# Patient Record
Sex: Male | Born: 1955
Health system: Southern US, Community
[De-identification: ages and names within clinical notes are randomized; demographics above are authoritative.]

---

## 2007-06-21 ENCOUNTER — Emergency Department (HOSPITAL_COMMUNITY): Admission: EM | Admit: 2007-06-21 | Discharge: 2007-06-21 | Payer: Self-pay | Admitting: Emergency Medicine

## 2008-12-09 ENCOUNTER — Encounter: Admission: RE | Admit: 2008-12-09 | Discharge: 2008-12-09 | Payer: Self-pay | Admitting: Gastroenterology

## 2010-01-14 IMAGING — RF DG ESOPHAGUS
15 of 17 series · 19 of 24 positions shown · non-contrast
Comparison: None.

CLINICAL DATA: Dysphagia

ESOPHOGRAM/BARIUM SWALLOW
TECHNIQUE: Combined double contrast and single contrast
examination performed using effervescent crystals, thick barium
liquid, and thin barium liquid.
Fluoroscopy time:  2.5 minutes.

[Series 1: run · 2 of 10 slices shown (1 of 15)]
[im 1/10]
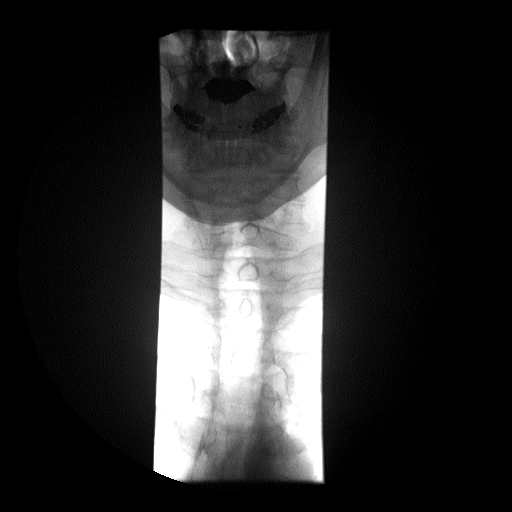
[im 10/10]
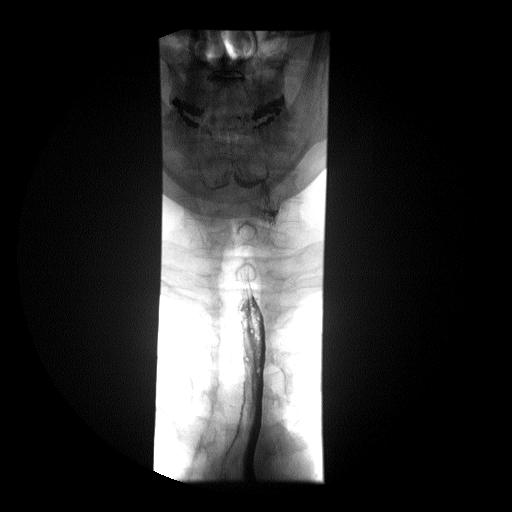

[Series 2: run · 2 of 12 slices shown (2 of 15)]
[im 6/12]
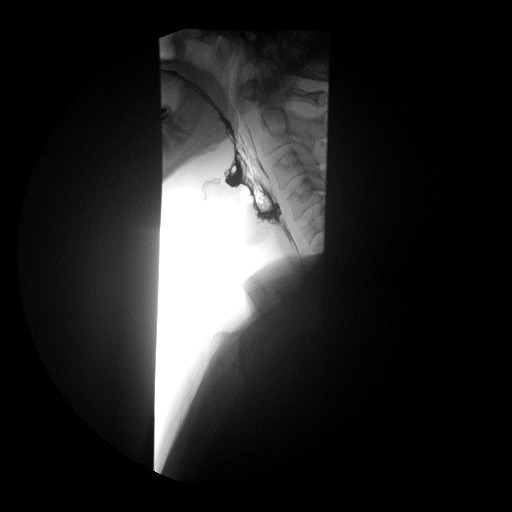
[im 12/12]
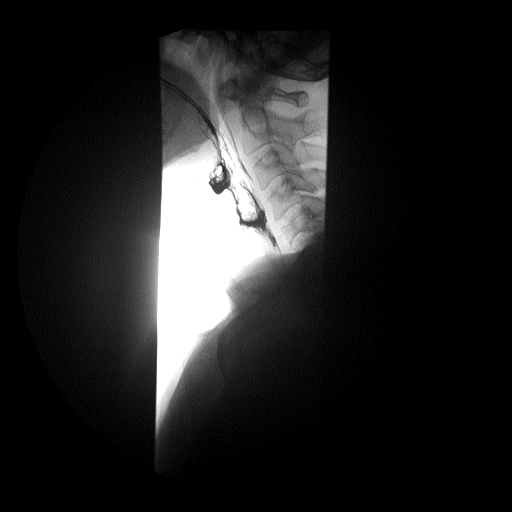

[Series 3: run · 2 of 10 slices shown (3 of 15)]
[im 1/10]
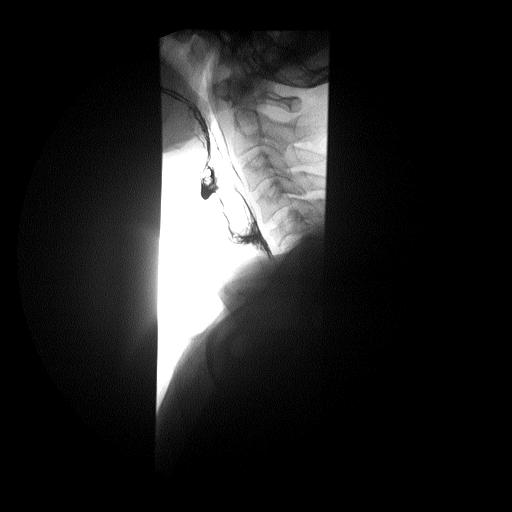
[im 5/10]
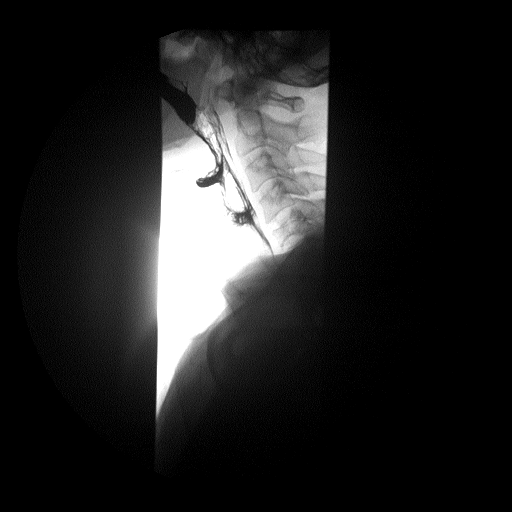

[Series 4: run · 1 of 1 slices shown (4 of 15)]
[im 1/1]
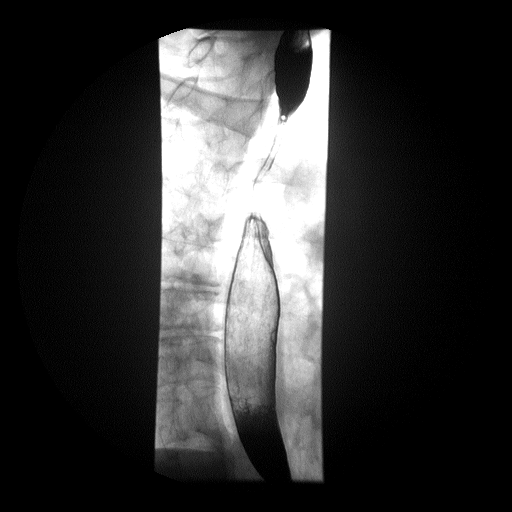

[Series 5: run · 1 of 2 slices shown (5 of 15)]
[im 1/2]
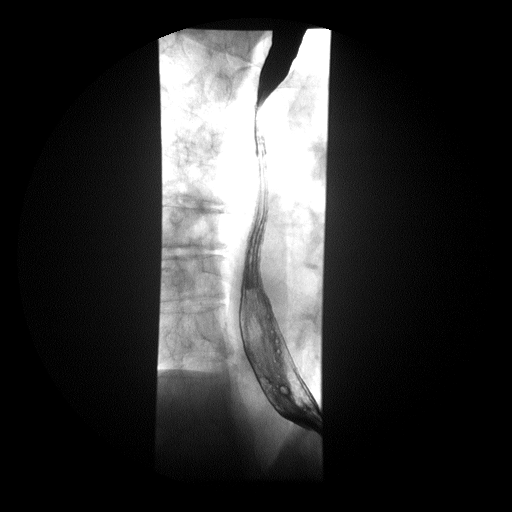

[Series 6: run · 1 of 1 slices shown (6 of 15)]
[im 1/1]
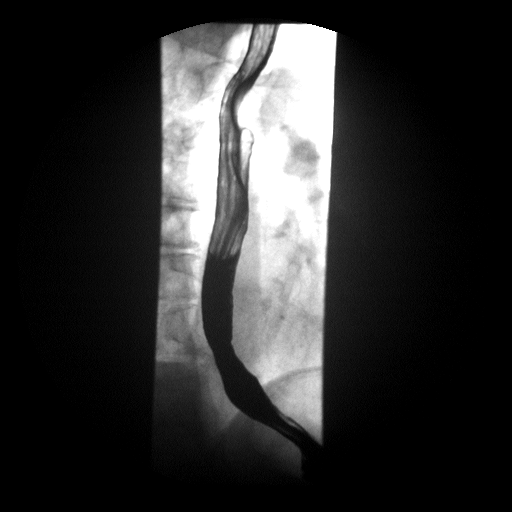

[Series 8: run · 1 of 1 slices shown (7 of 15)]
[im 1/1]
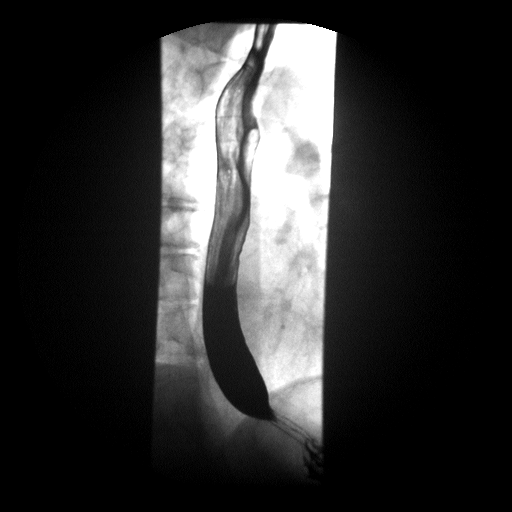

[Series 9: run · 1 of 1 slices shown (8 of 15)]
[im 1/1]
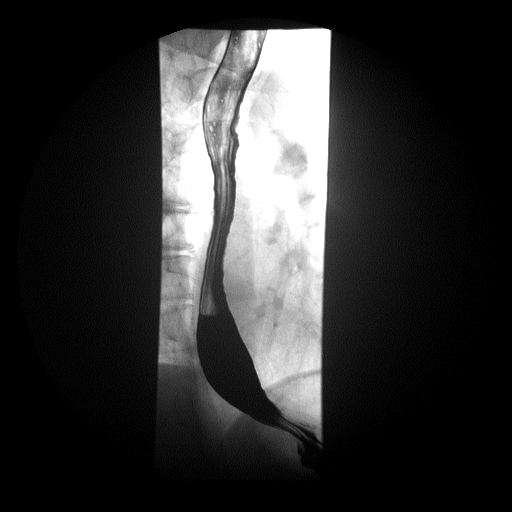

[Series 10: run · 1 of 1 slices shown (9 of 15)]
[im 1/1]
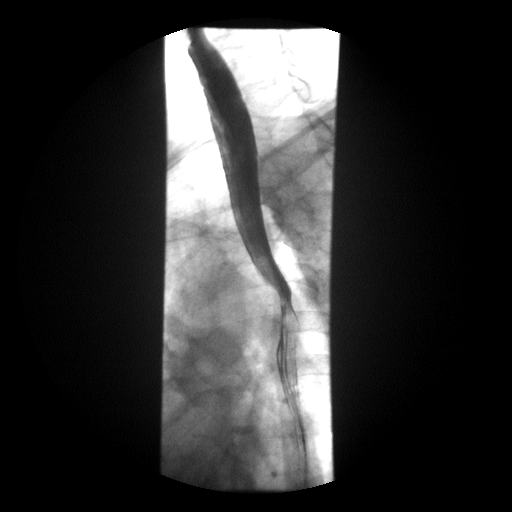

[Series 11: run · 1 of 1 slices shown (10 of 15)]
[im 1/1]
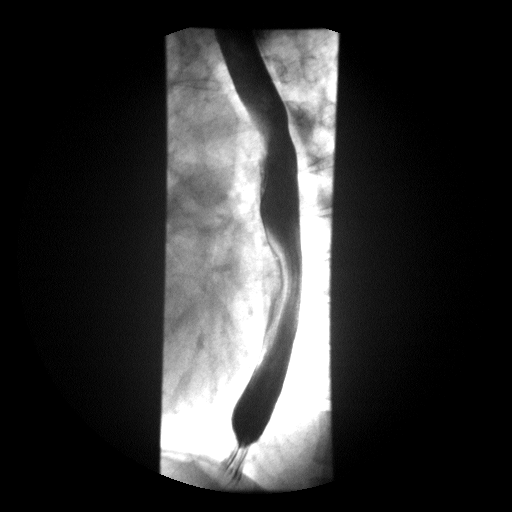

[Series 13: run · 1 of 1 slices shown (11 of 15)]
[im 1/1]
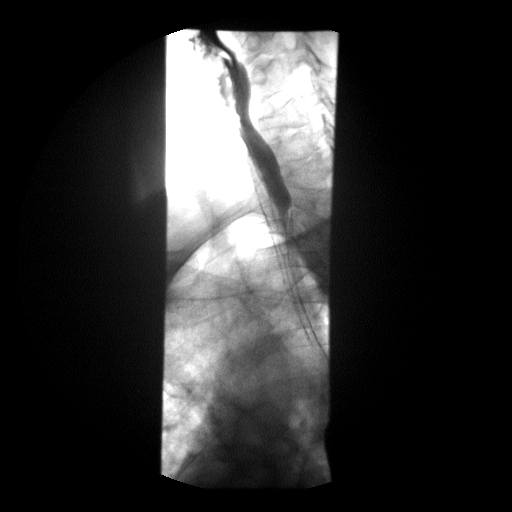

[Series 14: run · 1 of 1 slices shown (12 of 15)]
[im 1/1]
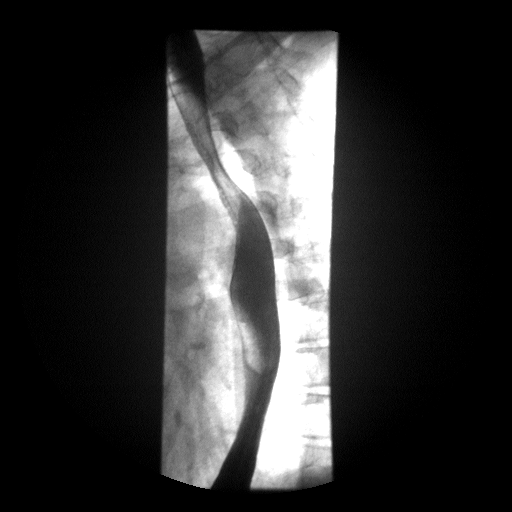

[Series 15: run · 1 of 1 slices shown (13 of 15)]
[im 1/1]
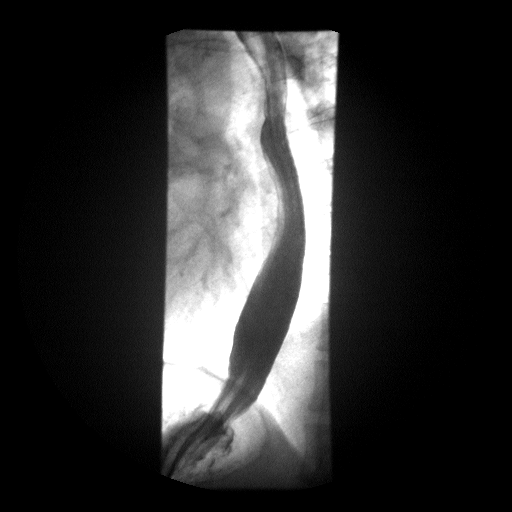

[Series 16: run · 1 of 1 slices shown (14 of 15)]
[im 1/1]
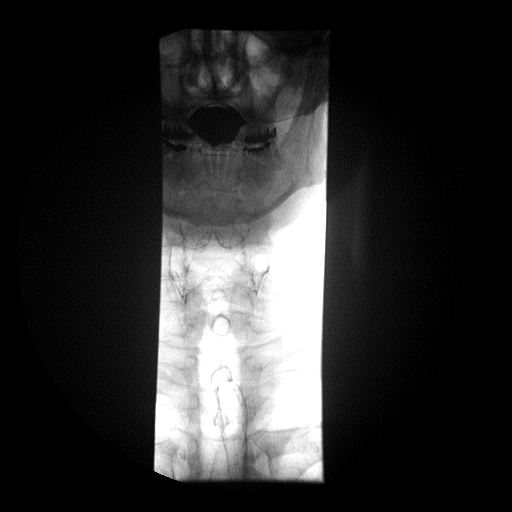

[Series 17: run · 2 of 8 slices shown (15 of 15)]
[im 4/8]
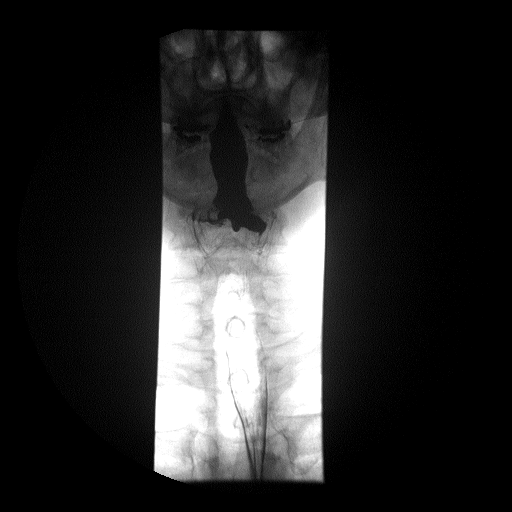
[im 8/8]
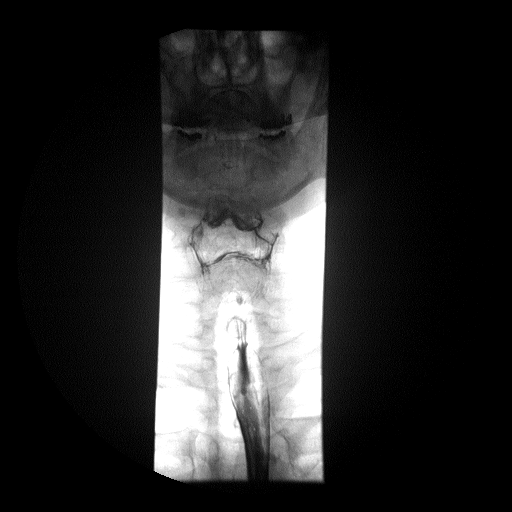

[19 of 24 positions shown; findings below may reference images not displayed]

FINDINGS: Frontal imaging of the hypopharynx with swallowing shows
some asymmetry of contrast migration from the hypopharynx into the
proximal esophagus.  This results in appearance of a right-sided
mass effect in the hypopharynx.  However after swallowing, the
contrast coated mucosa of the hypopharynx is quite symmetric and
normal in appearance.  Lateral views of the hypopharynx while
swallowing are normal.

Double contrast imaging of the esophagus is normal.  There is no
esophageal mass, stricture, or diverticulum.

Esophageal motility is normal.

A 13 mm barium tablet passes readily into the stomach when taken
with water.
IMPRESSION: Some asymmetry in the hypopharyngeal region with swallowing raises
the question of mass effect on the right.  This is not evident when
the hypopharynx is distended after swallowing and coated with
barium.  Although considered unlikely, a right-sided hypopharyngeal
mass is not entirely excluded. Consider direct visualization to
exclude underlying mass lesion.

Otherwise normal double contrast barium esophagram.

## 2013-09-09 ENCOUNTER — Other Ambulatory Visit: Payer: Self-pay | Admitting: Family Medicine

## 2013-09-09 DIAGNOSIS — IMO0002 Reserved for concepts with insufficient information to code with codable children: Secondary | ICD-10-CM

## 2013-09-09 DIAGNOSIS — R229 Localized swelling, mass and lump, unspecified: Principal | ICD-10-CM

## 2013-09-15 ENCOUNTER — Ambulatory Visit
Admission: RE | Admit: 2013-09-15 | Discharge: 2013-09-15 | Disposition: A | Discharge status: Home / Self Care | Payer: BC Managed Care – PPO | Source: Ambulatory Visit | Attending: Family Medicine | Admitting: Family Medicine

## 2013-09-15 DIAGNOSIS — IMO0002 Reserved for concepts with insufficient information to code with codable children: Secondary | ICD-10-CM

## 2013-09-15 DIAGNOSIS — R229 Localized swelling, mass and lump, unspecified: Principal | ICD-10-CM

## 2013-09-15 MED ORDER — GADOBENATE DIMEGLUMINE 529 MG/ML IV SOLN
15.0000 mL | Freq: Once | INTRAVENOUS | Status: AC | PRN
  Administered 2013-09-15: 15 mL via INTRAVENOUS

## 2013-09-16 ENCOUNTER — Other Ambulatory Visit: Payer: Self-pay | Admitting: Family Medicine

## 2013-09-16 ENCOUNTER — Inpatient Hospital Stay
Admission: RE | Admit: 2013-09-16 | Discharge: 2013-09-16 | Disposition: A | Discharge status: Home / Self Care | Payer: Self-pay | Source: Ambulatory Visit | Attending: Family Medicine | Admitting: Family Medicine

## 2013-09-16 DIAGNOSIS — R52 Pain, unspecified: Secondary | ICD-10-CM

## 2014-10-01 ENCOUNTER — Other Ambulatory Visit: Payer: Self-pay | Admitting: Surgery

## 2014-10-01 NOTE — H&P (Signed)
Lawrence Burton 10/01/2014 10:08 AM Location: [facility] Patient #: [id number] DOB: 1955-08-19 Married / Language: Undefined / Race: Undefined Male History of Present Illness Lawrence Sportsman MD; 10/01/2014 10:49 AM) Patient words: hernia.  The patient is a 59 year old male who presents with an inguinal hernia. Patient sent for consult by PCP Dr Lawrence Burton with Lawrence Burton. Concern for inguinal hernia Pleasant active male. Originally from [place]. Here today with his wife. Rides bicycle a lot. 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 Noticed RLQ/groin pain. Concerned. Saw PCP. Right inguinal hernia diagnosed. Surgical consultation requested. Patient has bowel movement every day. No bad constipation or diarrhea. No prior abdominal surgeries. No history of prior hernias. He is wearing a truss which seems to help him feel more comfortable while he is at work. Moderate twisting activity level. No nausea or vomiting. No problems with urination. No history of infections. He does not smoke.  Other Problems Lawrence Burton, CMA; 10/01/2014 10:08 AM) Back Pain Hypercholesterolemia  Past Surgical History Lawrence Burton, CMA; 10/01/2014 10:08 AM) No pertinent past surgical history  Diagnostic Studies History Lawrence Burton, CMA; 10/01/2014 10:08 AM) Colonoscopy 5-10 years ago  Allergies Lawrence Burton, CMA; 10/01/2014 10:09 AM) No Known Drug Allergies 10/01/2014  Medication History (Lawrence Burton, CMA; 10/01/2014 10:09 AM) No Current Medications Medications Reconciled  Social History Lawrence Burton, CMA; 10/01/2014 10:08 AM) Alcohol use Occasional alcohol use. Caffeine use Coffee. No drug use Tobacco use Former smoker.  Family History Lawrence Burton, CMA; 10/01/2014 10:08 AM) Cancer Father.     Review of Systems (Lawrence Burton CMA; 10/01/2014 10:08 AM) General Not Present- Appetite Loss, Chills, Fatigue, Fever, Night Sweats, Weight Gain and Weight  Loss. Skin Not Present- Change in Wart/Mole, Dryness, Hives, Jaundice, New Lesions, Non-Healing Wounds, Rash and Ulcer. HEENT Not Present- Earache, Hearing Loss, Hoarseness, Nose Bleed, Oral Ulcers, Ringing in the Ears, Seasonal Allergies, Sinus Pain, Sore Throat, Visual Disturbances, Wears glasses/contact lenses and Yellow Eyes. Respiratory Not Present- Bloody sputum, Chronic Cough, Difficulty Breathing, Snoring and Wheezing. Breast Not Present- Breast Mass, Breast Pain, Nipple Discharge and Skin Changes. Cardiovascular Not Present- Chest Pain, Difficulty Breathing Lying Down, Leg Cramps, Palpitations, Rapid Heart Rate, Shortness of Breath and Swelling of Extremities. Gastrointestinal Not Present- Abdominal Pain, Bloating, Bloody Stool, Change in Bowel Habits, Chronic diarrhea, Constipation, Difficulty Swallowing, Excessive gas, Gets full quickly at meals, Hemorrhoids, Indigestion, Nausea, Rectal Pain and Vomiting. Male Genitourinary Not Present- Blood in Urine, Change in Urinary Stream, Frequency, Impotence, Nocturia, Painful Urination, Urgency and Urine Leakage. Musculoskeletal Not Present- Back Pain, Joint Pain, Joint Stiffness, Muscle Pain, Muscle Weakness and Swelling of Extremities. Neurological Not Present- Decreased Memory, Fainting, Headaches, Numbness, Seizures, Tingling, Tremor, Trouble walking and Weakness. Psychiatric Not Present- Anxiety, Bipolar, Change in Sleep Pattern, Depression, Fearful and Frequent crying. Endocrine Not Present- Cold Intolerance, Excessive Hunger, Hair Changes, Heat Intolerance, Hot flashes and New Diabetes. Hematology Not Present- Easy Bruising, Excessive bleeding, Gland problems, HIV and Persistent Infections.  Vitals (Lawrence Burton CMA; 10/01/2014 10:09 AM) 10/01/2014 10:09 AM Weight: 174.8 lb Height: 69in Body Surface Area: 1.96 m Body Mass Index: 25.81 kg/m Temp.: 64F(Temporal)  Pulse: 79 (Regular)  BP: 124/80 (Sitting, Left Arm,  Standard)     Physical Exam Lawrence Sportsman MD; 10/01/2014 10:46 AM)  General Mental Status-Alert. General Appearance-Not in acute distress, Not Sickly. Orientation-Oriented X3. Hydration-Well hydrated. Voice-Normal.  Integumentary Global Assessment Upon inspection and palpation of skin surfaces of the - Axillae: non-tender, no inflammation or  ulceration, no drainage. and Distribution of scalp and body hair is normal. General Characteristics Temperature - normal warmth is noted.  Head and Neck Head-normocephalic, atraumatic with no lesions or palpable masses. Face Global Assessment - atraumatic, no absence of expression. Neck Global Assessment - no abnormal movements, no bruit auscultated on the right, no bruit auscultated on the left, no decreased range of motion, non-tender. Trachea-midline. Thyroid Gland Characteristics - non-tender.  Eye Eyeball - Left-Extraocular movements intact, No Nystagmus. Eyeball - Right-Extraocular movements intact, No Nystagmus. Cornea - Left-No Hazy. Cornea - Right-No Hazy. Sclera/Conjunctiva - Left-No scleral icterus, No Discharge. Sclera/Conjunctiva - Right-No scleral icterus, No Discharge. Pupil - Left-Direct reaction to light normal. Pupil - Right-Direct reaction to light normal.  ENMT Ears Pinna - Left - no drainage observed, no generalized tenderness observed. Right - no drainage observed, no generalized tenderness observed. Nose and Sinuses External Inspection of the Nose - no destructive lesion observed. Inspection of the nares - Left - quiet respiration. Right - quiet respiration. Mouth and Throat Lips - Upper Lip - no fissures observed, no pallor noted. Lower Lip - no fissures observed, no pallor noted. Nasopharynx - no discharge present. Oral Cavity/Oropharynx - Tongue - no dryness observed. Oral Mucosa - no cyanosis observed. Hypopharynx - no evidence of airway distress observed.  Chest and Lung  Exam Inspection Movements - Normal and Symmetrical. Accessory muscles - No use of accessory muscles in breathing. Palpation Palpation of the chest reveals - Non-tender. Auscultation Breath sounds - Normal and Clear.  Cardiovascular Auscultation Rhythm - Regular. Murmurs & Other Heart Sounds - Auscultation of the heart reveals - No Murmurs and No Systolic Clicks.  Abdomen Inspection Inspection of the abdomen reveals - No Visible peristalsis and No Abnormal pulsations. Umbilicus - No Bleeding, No Urine drainage. Palpation/Percussion Palpation and Percussion of the abdomen reveal - Soft, Non Tender, No Rebound tenderness, No Rigidity (guarding) and No Cutaneous hyperesthesia.  Male Genitourinary Sexual Maturity Tanner 5 - Adult hair pattern and Adult penile size and shape. Note: Uncircumcised male. Epididymides testes and spermatic cords normal. Obvious RIGHT groin bulge reducible consistent with inguinal hernia. Subtle impulse on the LEFT side with Valsalva only   Peripheral Vascular Upper Extremity Inspection - Left - No Cyanotic nailbeds, Not Ischemic. Right - No Cyanotic nailbeds, Not Ischemic.  Neurologic Neurologic evaluation reveals -normal attention span and ability to concentrate, able to name objects and repeat phrases. Appropriate fund of knowledge , normal sensation and normal coordination. Mental Status Affect - not angry, not paranoid. Cranial Nerves-Normal Bilaterally. Gait-Normal.  Neuropsychiatric Mental status exam performed with findings of-able to articulate well with normal speech/language, rate, volume and coherence, thought content normal with ability to perform basic computations and apply abstract reasoning and no evidence of hallucinations, delusions, obsessions or homicidal/suicidal ideation.  Musculoskeletal Global Assessment Spine, Ribs and Pelvis - no instability, subluxation or laxity. Right Upper Extremity - no instability, subluxation or  laxity.  Lymphatic Head & Neck  General Head & Neck Lymphatics: Bilateral - Description - No Localized lymphadenopathy. Axillary  General Axillary Region: Bilateral - Description - No Localized lymphadenopathy. Femoral & Inguinal  Generalized Femoral & Inguinal Lymphatics: Left - Description - No Localized lymphadenopathy. Right - Description - No Localized lymphadenopathy.    Assessment & Plan Lawrence Sportsman MD; 10/01/2014 10:48 AM)  RIGHT INGUINAL HERNIA (550.90  K40.90) Impression: Definite RIGHT inguinal hernia. Possible early small LEFT inguinal hernia in a very active male with cycling in moderate activity.  I think he  would benefit from surgery to repair the RIGHT and make sure the LEFT is not an issue. The patient & his wife wish to be aggressive and avoid further problems.  He is hoping to do this and be able to recover before Labor Day to get back to the busier time of work. I noted it takes him traveling but will coordinate around surgery.  He feels like wearing the truss helps him. Reasonable to continue that as tolerated.  Current Plans Schedule for Surgery Pt Education - Pamphlet Given - Laparoscopic Hernia Repair: discussed with patient and provided information. The anatomy & physiology of the abdominal wall and pelvic floor was discussed. The pathophysiology of hernias in the inguinal and pelvic region was discussed. Natural history risks such as progressive enlargement, pain, incarceration, and strangulation was discussed. Contributors to complications such as smoking, obesity, diabetes, prior surgery, etc were discussed.  I feel the risks of no intervention will lead to serious problems that outweigh the operative risks; therefore, I recommended surgery to reduce and repair the hernia. I explained laparoscopic techniques with possible need for an open approach. I noted usual use of mesh to patch and/or buttress hernia repair  Risks such as bleeding, infection,  abscess, need for further treatment, heart attack, death, and other risks were discussed. I noted a good likelihood this will help address the problem. Goals of post-operative recovery were discussed as well. Possibility that this will not correct all symptoms was explained. I stressed the importance of low-impact activity, aggressive pain control, avoiding constipation, & not pushing through pain to minimize risk of post-operative chronic pain or injury. Possibility of reherniation was discussed. We will work to minimize complications.  An educational handout further explaining the pathology & treatment options was given as well. Questions were answered. The patient expresses understanding & wishes to proceed with surgery. Pt Education - [facility] Hernia Post-Op HCI (Tavaria Mackins): discussed with patient and provided information. Pt Education - [facility] Pain Control (Richey Doolittle) Pt Education - [facility] Good Bowel Health (Gracious Renken)  Lawrence Burton, M.D., F.A.C.S. Gastrointestinal and Minimally Invasive Surgery [facility] [address], [address] [place], [place] [address] [phone] Main / Paging

## 2017-05-01 DIAGNOSIS — H40023 Open angle with borderline findings, high risk, bilateral: Secondary | ICD-10-CM | POA: Diagnosis not present

## 2017-06-29 DIAGNOSIS — Z23 Encounter for immunization: Secondary | ICD-10-CM | POA: Diagnosis not present

## 2017-07-31 DIAGNOSIS — R208 Other disturbances of skin sensation: Secondary | ICD-10-CM | POA: Diagnosis not present

## 2018-02-19 DIAGNOSIS — K469 Unspecified abdominal hernia without obstruction or gangrene: Secondary | ICD-10-CM | POA: Diagnosis not present

## 2018-02-23 ENCOUNTER — Other Ambulatory Visit: Payer: Self-pay | Admitting: Physician Assistant

## 2018-02-26 ENCOUNTER — Other Ambulatory Visit: Payer: Self-pay | Admitting: Physician Assistant

## 2018-02-26 DIAGNOSIS — R1900 Intra-abdominal and pelvic swelling, mass and lump, unspecified site: Secondary | ICD-10-CM

## 2018-02-28 DIAGNOSIS — Z23 Encounter for immunization: Secondary | ICD-10-CM | POA: Diagnosis not present

## 2018-03-01 ENCOUNTER — Ambulatory Visit
Admission: RE | Admit: 2018-03-01 | Discharge: 2018-03-01 | Disposition: A | Discharge status: Home / Self Care | Payer: BLUE CROSS/BLUE SHIELD | Source: Ambulatory Visit | Attending: Physician Assistant | Admitting: Physician Assistant

## 2018-03-01 DIAGNOSIS — R1900 Intra-abdominal and pelvic swelling, mass and lump, unspecified site: Secondary | ICD-10-CM

## 2018-03-14 DIAGNOSIS — Z0000 Encounter for general adult medical examination without abnormal findings: Secondary | ICD-10-CM | POA: Diagnosis not present

## 2018-03-14 DIAGNOSIS — Z125 Encounter for screening for malignant neoplasm of prostate: Secondary | ICD-10-CM | POA: Diagnosis not present

## 2018-03-14 DIAGNOSIS — E7800 Pure hypercholesterolemia, unspecified: Secondary | ICD-10-CM | POA: Diagnosis not present

## 2018-05-08 DIAGNOSIS — H40023 Open angle with borderline findings, high risk, bilateral: Secondary | ICD-10-CM | POA: Diagnosis not present

## 2018-05-30 ENCOUNTER — Ambulatory Visit (INDEPENDENT_AMBULATORY_CARE_PROVIDER_SITE_OTHER): Payer: BLUE CROSS/BLUE SHIELD

## 2018-05-30 ENCOUNTER — Ambulatory Visit (INDEPENDENT_AMBULATORY_CARE_PROVIDER_SITE_OTHER): Payer: BLUE CROSS/BLUE SHIELD | Admitting: Podiatry

## 2018-05-30 ENCOUNTER — Other Ambulatory Visit: Payer: Self-pay | Admitting: Podiatry

## 2018-05-30 ENCOUNTER — Encounter: Payer: Self-pay | Admitting: Podiatry

## 2018-05-30 VITALS — BP 105/67

## 2018-05-30 DIAGNOSIS — M79672 Pain in left foot: Principal | ICD-10-CM

## 2018-05-30 DIAGNOSIS — M779 Enthesopathy, unspecified: Secondary | ICD-10-CM | POA: Diagnosis not present

## 2018-05-30 DIAGNOSIS — G629 Polyneuropathy, unspecified: Secondary | ICD-10-CM

## 2018-05-30 DIAGNOSIS — M79671 Pain in right foot: Secondary | ICD-10-CM

## 2018-05-30 MED ORDER — GABAPENTIN 300 MG PO CAPS: 300.0000 mg | ORAL_CAPSULE | Freq: Three times a day (TID) | ORAL | 3 refills | Status: AC

## 2018-05-30 NOTE — Progress Notes (Signed)
Subjective:   Patient ID: Lawrence Burton, male   DOB: 63 y.o.   MRN: [id number]   HPI Patient presents stating that he has had burning in his feet of approximate 8 months duration and does not remember any factor that may have created this.  States it is gradually gotten worse and has not had any balance issues or falling issues currently   Review of Systems  All other systems reviewed and are negative.       Objective:  Physical Exam Vitals signs and nursing note reviewed.  Constitutional:      Appearance: He is well-developed.  Pulmonary:     Effort: Pulmonary effort is normal.  Musculoskeletal: Normal range of motion.  Skin:    General: Skin is warm.  Neurological:     Mental Status: He is alert.     Neurovascular status found to be intact muscle strength was adequate range of motion within normal limits with patient found to have only mild discomfort upon palpation to the metatarsal phalangeal joints and forefoot bilateral with no breakdown of tissue noted or other pathology.  Patient was found to have good digital perfusion and is well oriented x3 and had normal sharp dull vibratory currently     Assessment:  Possibility that we may be dealing with mild neuropathic symptomatology at this time that is most likely idiopathic in origin but may involve other organ systems     Plan:  H&P x-rays reviewed conditions discussed and at this point I have recommended gabapentin starting 1 at night followed by one morning and 1 midday and we will see whether not this will control symptoms.  Patient will be seen back for [place] to reevaluate in the next 2 months or earlier if necessary  X-rays were negative for signs of bony pathology or other situation that could explain the scenario and the symptoms he is experiencing

## 2018-06-18 DIAGNOSIS — Z1211 Encounter for screening for malignant neoplasm of colon: Secondary | ICD-10-CM | POA: Diagnosis not present

## 2018-08-01 ENCOUNTER — Ambulatory Visit: Payer: BLUE CROSS/BLUE SHIELD | Admitting: Podiatry

## 2018-08-29 DIAGNOSIS — M25522 Pain in left elbow: Secondary | ICD-10-CM | POA: Diagnosis not present

## 2018-11-29 ENCOUNTER — Other Ambulatory Visit: Payer: Self-pay

## 2018-11-29 DIAGNOSIS — Z20822 Contact with and (suspected) exposure to covid-19: Secondary | ICD-10-CM

## 2018-11-30 ENCOUNTER — Telehealth: Payer: Self-pay

## 2018-11-30 LAB — NOVEL CORONAVIRUS, NAA: SARS-CoV-2, NAA: NOT DETECTED

## 2018-12-03 NOTE — Telephone Encounter (Signed)
Provided Covid -19 results to Patient Voiced understanding.

## 2019-01-04 DIAGNOSIS — Z23 Encounter for immunization: Secondary | ICD-10-CM | POA: Diagnosis not present

## 2019-03-18 DIAGNOSIS — Z125 Encounter for screening for malignant neoplasm of prostate: Secondary | ICD-10-CM | POA: Diagnosis not present

## 2019-03-18 DIAGNOSIS — Z0000 Encounter for general adult medical examination without abnormal findings: Secondary | ICD-10-CM | POA: Diagnosis not present

## 2019-03-18 DIAGNOSIS — E7800 Pure hypercholesterolemia, unspecified: Secondary | ICD-10-CM | POA: Diagnosis not present

## 2019-04-06 IMAGING — US US ABDOMEN LIMITED
1 series · 7 of 7 positions shown · non-contrast
Comparison: None.

CLINICAL DATA: Abdominal wall bulge.

EXAM:
ULTRASOUND ABDOMEN LIMITED

[Series 1: us abdomen limited · 0.23mm/px · 7 of 7 slices shown]
[im 1/7]
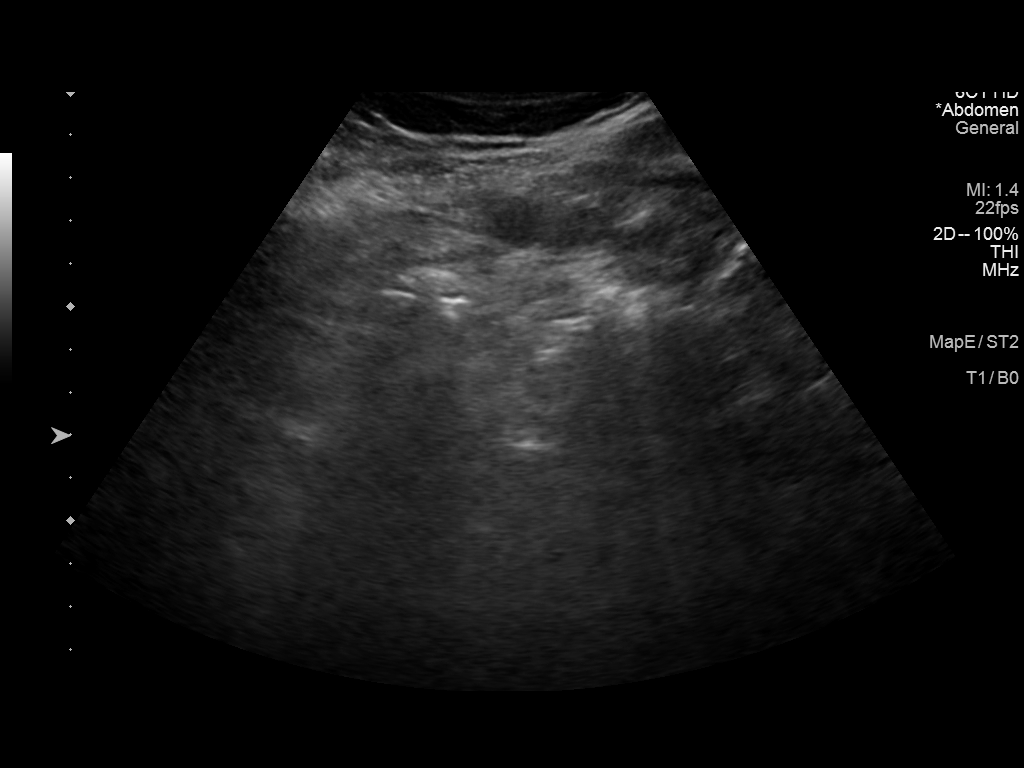
[im 2/7]
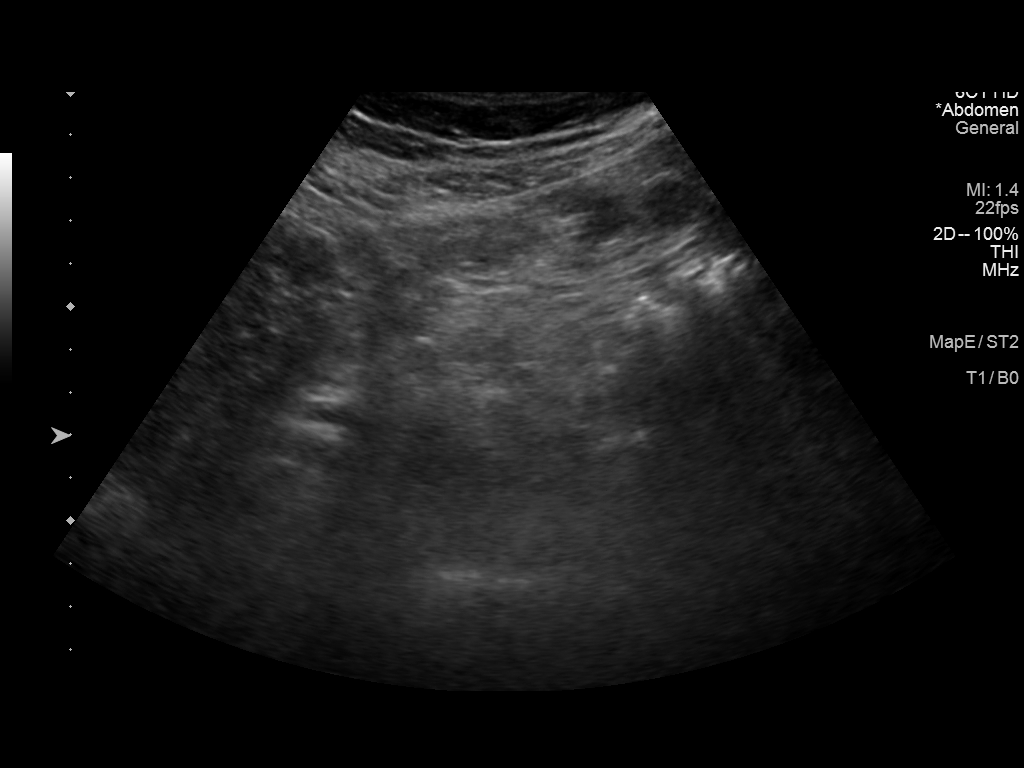
[im 3/7]
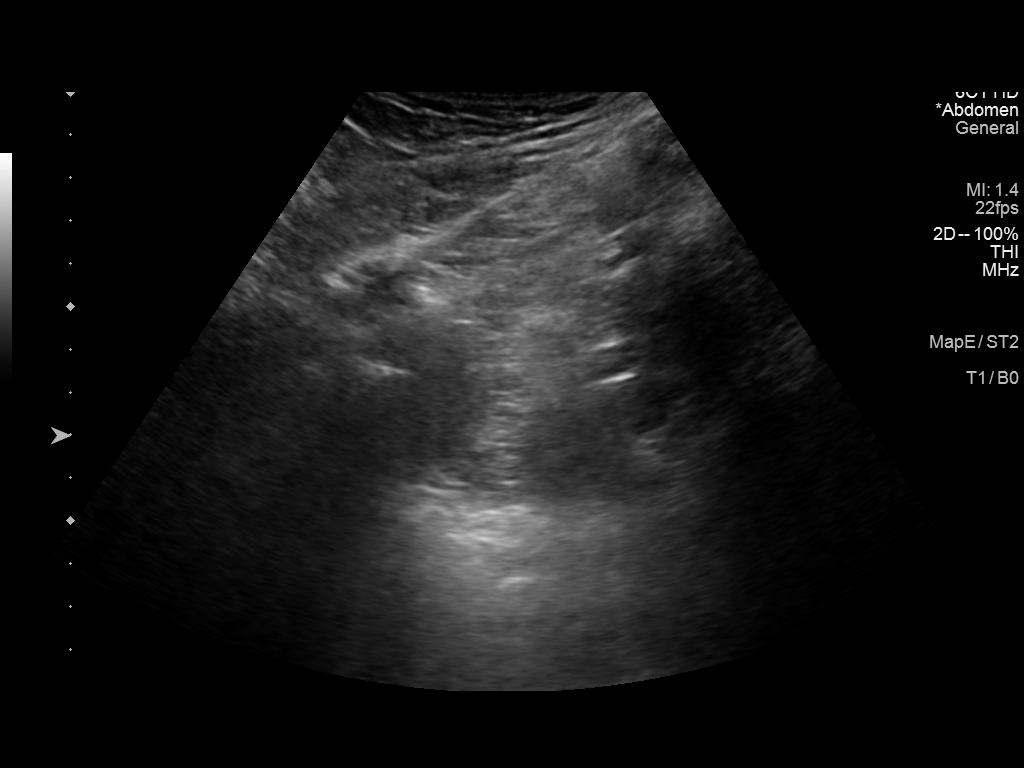
[im 4/7]
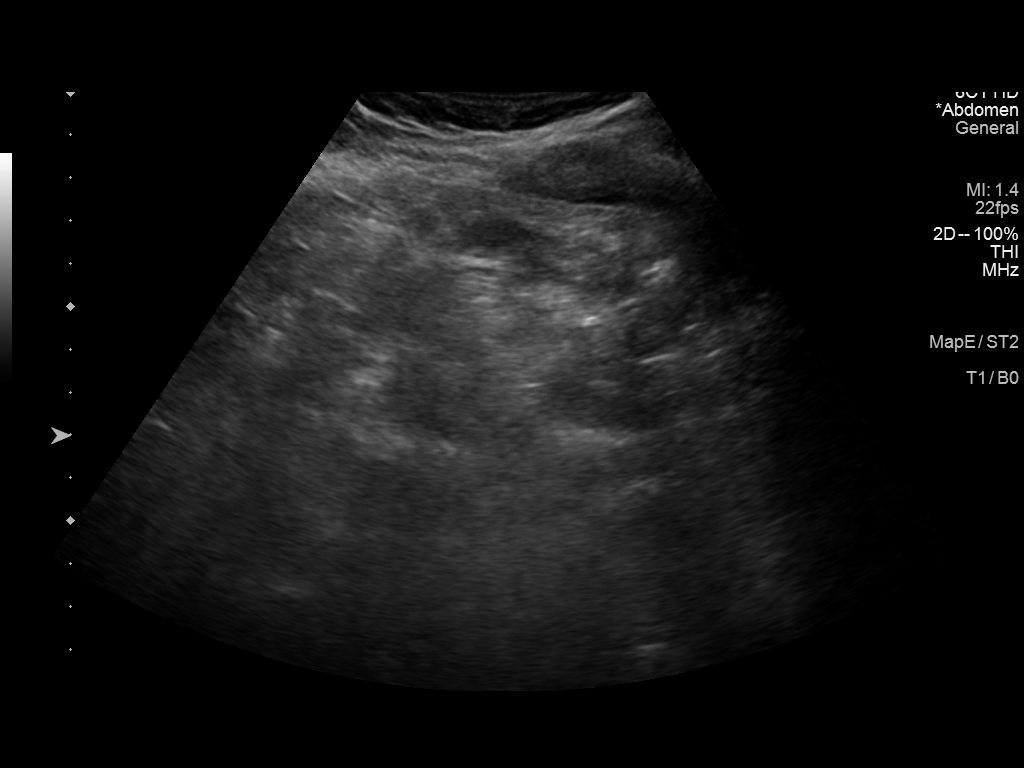
[im 5/7]
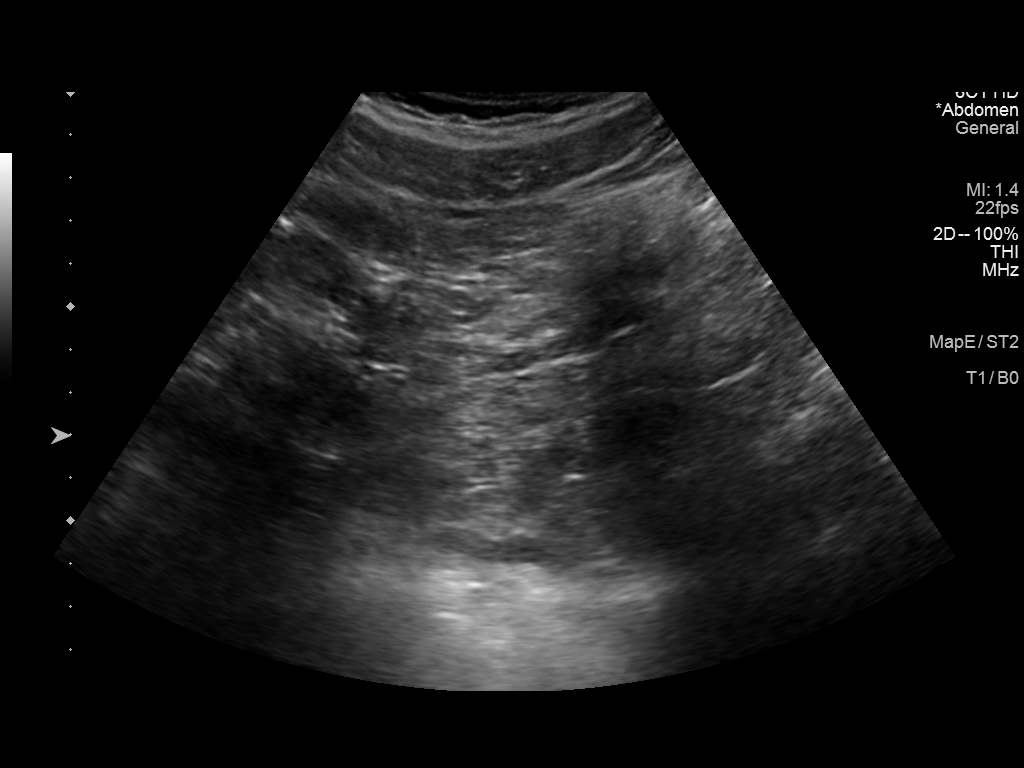
[im 6/7]
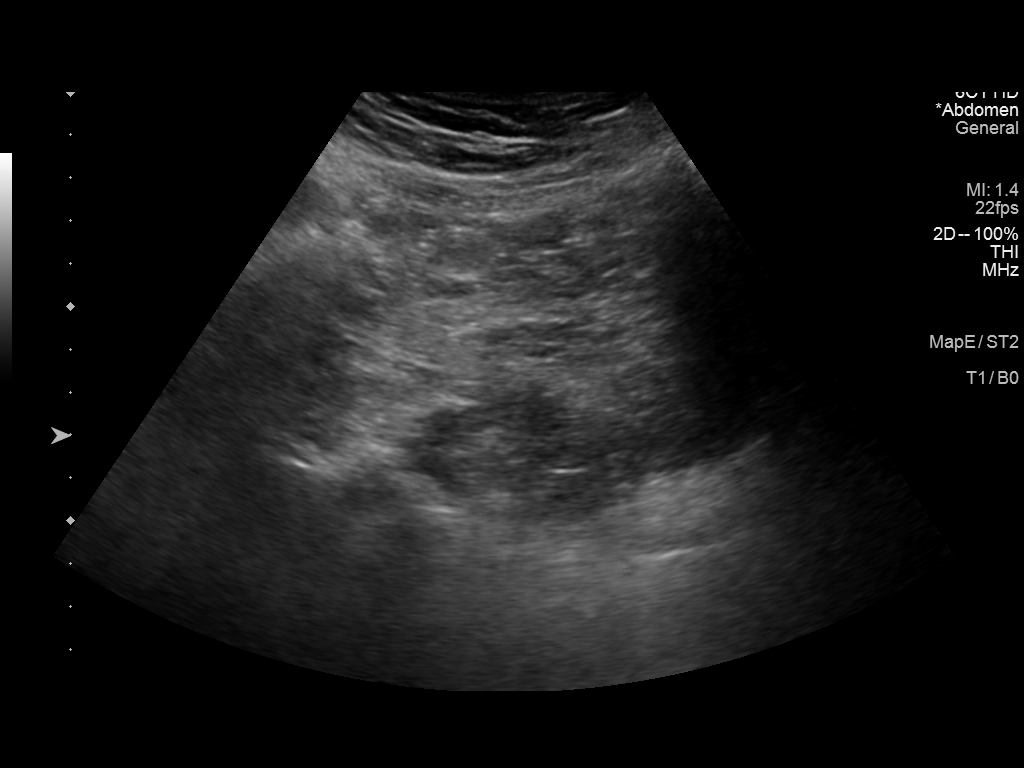
[im 7/7]
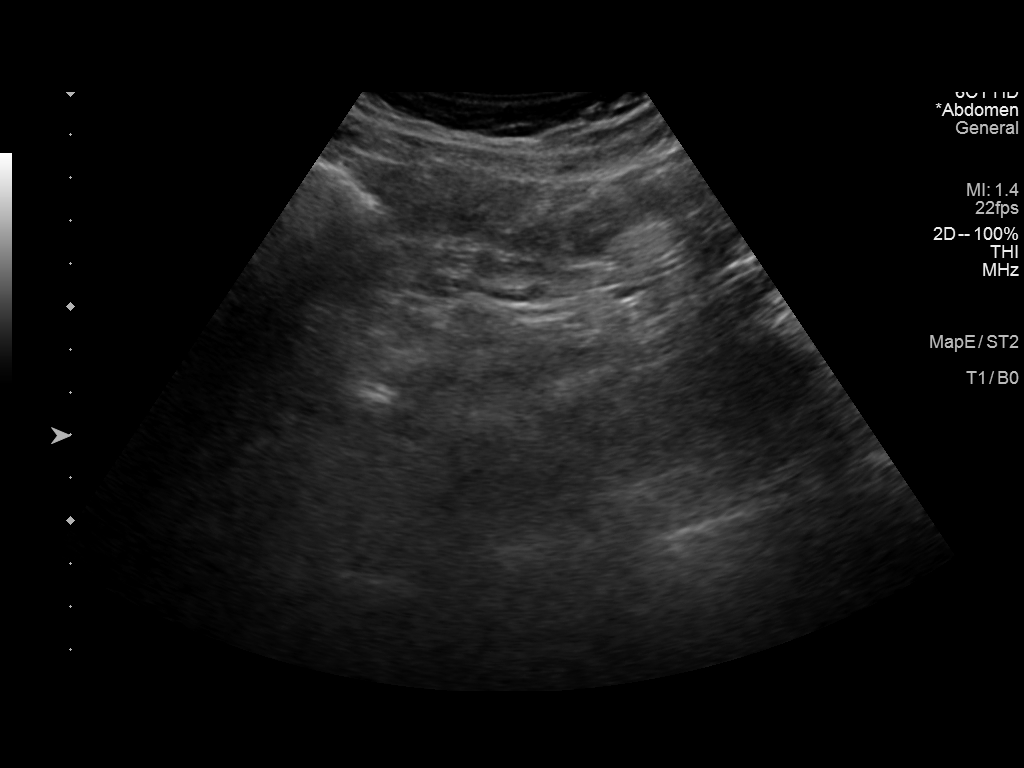

[7 of 7 positions shown; findings below may reference images not displayed]

FINDINGS: No ventral hernia is identified. There is no soft tissue mass or
cystic lesion.
IMPRESSION: Negative soft tissue ultrasound in the region of the abdominal
bulge.

## 2019-06-10 DIAGNOSIS — H40023 Open angle with borderline findings, high risk, bilateral: Secondary | ICD-10-CM | POA: Diagnosis not present

## 2019-09-10 DIAGNOSIS — H40023 Open angle with borderline findings, high risk, bilateral: Secondary | ICD-10-CM | POA: Diagnosis not present
